# Patient Record
Sex: Male | Born: 1985 | Race: Black or African American | Hispanic: No | Marital: Single | Smoking: Current every day smoker
Health system: Southern US, Community
[De-identification: ages and names within clinical notes are randomized; demographics above are authoritative.]

## PROBLEM LIST (undated history)

## (undated) ENCOUNTER — Emergency Department (HOSPITAL_COMMUNITY): Admission: EM | Payer: Self-pay

## (undated) HISTORY — PX: APPENDECTOMY: SHX54

---
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
## 2006-01-13 ENCOUNTER — Emergency Department (HOSPITAL_COMMUNITY): Admission: EM | Admit: 2006-01-13 | Discharge: 2006-01-13 | Payer: Self-pay | Admitting: Emergency Medicine

## 2006-01-17 ENCOUNTER — Emergency Department (HOSPITAL_COMMUNITY): Admission: EM | Admit: 2006-01-17 | Discharge: 2006-01-17 | Payer: Self-pay | Admitting: Emergency Medicine

## 2007-04-01 ENCOUNTER — Emergency Department (HOSPITAL_COMMUNITY): Admission: EM | Admit: 2007-04-01 | Discharge: 2007-04-01 | Payer: Self-pay | Admitting: Emergency Medicine

## 2007-06-21 ENCOUNTER — Emergency Department (HOSPITAL_COMMUNITY): Admission: EM | Admit: 2007-06-21 | Discharge: 2007-06-22 | Payer: Self-pay | Admitting: Emergency Medicine

## 2010-09-30 NOTE — H&P (Signed)
NAME:  Zachary Weber, Zachary Weber                        ACCOUNT NO.:  [id number]   MEDICAL RECORD NO.:  [id number]                   PATIENT TYPE:  EMS   LOCATION:  ED                                   FACILITY:  [facility]   PHYSICIAN:  Adolph Pollack, M.D.            DATE OF BIRTH:  [birth year]   DATE OF ADMISSION:  01/12/2004  DATE OF DISCHARGE:                                HISTORY & PHYSICAL   CHIEF COMPLAINT:  Abdominal pain with diarrhea and chills.   HISTORY OF PRESENT ILLNESS:  This 25 year old male had something to eat on  Sunday, developed some crampy periumbilical pain followed by some diarrhea  and radiation of the pain to the right lower quadrant and lower abdominal  area.  He has also had chills.  The pain has persisted and gotten a little  bit worse.  He is still having some intermittent diarrhea.  He took Pepto-  Bismol but had no relief.  He states he has not had anything like this  before.  He was seen by Dr. Carren Rang in the emergency department with  concern for appendicitis and I was subsequently asked to see him.   PAST MEDICAL HISTORY:  No chronic illnesses.  Previous operations:  None.   ALLERGIES:  None.   MEDICATIONS:  None.   SOCIAL HISTORY:  He reports intermittently drinking alcohol.  Does do some  smoking.  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   FAMILY HISTORY:  Positive for diabetes and hypertension.   REVIEW OF SYSTEMS:  CARDIAC:  No known heart disease or hypertension.  PULMONARY:  No asthma or pneumonia.  GI:  No intestinal problems, no peptic  ulcer disease, no hepatitis.  GU:  No dysuria or hematuria.  NEUROLOGIC:  No  seizure disorders.  HEMATOLOGIC:  No sickle cell disease, bleeding problems.   PHYSICAL EXAMINATION:  GENERAL:  An ill-appearing male with temperature  101.1, blood pressure 113/58, pulse 96, respiratory rate 18, lying still on  his left side.  EYES:  Extraocular motions intact, no icterus.  NECK:  Supple  without masses or obvious thyroid enlargement.  RESPIRATORY:  Breast sounds equal and clear.  Respirations unlabored.  CARDIOVASCULAR:  Heart demonstrates a regular rate and rhythm, no murmur  heard.  No lower extremity edema, no JVD.  ABDOMEN:  Slightly firm with right lower quadrant tenderness and guarding to  both palpation and percussion.  No palpable masses are present.  Hypoactive  bowel sounds are noted.  MUSCULOSKELETAL:  Has good muscle tone present.  NEUROLOGIC:  He is alert and oriented and responds to questions  appropriately and follows commands.   LABORATORY DATA:  Urinalysis negative.  White blood cell count 17,900 with a  left shift.  Hemoglobin 14.1.  Potassium 3.3.  Liver function tests normal.   IMPRESSION:  Acute abdomen with signs and symptoms consistent with acute  appendicitis with possible  perforation.   PLAN:  To the operating room for diagnostic laparoscopy with appendectomy,  possible exploratory laparotomy.  I went over the procedure and rationale  and risks with him and his parents.  The risks include but are not limited  to bleeding, infection, accidental damage to intraabdominal organs, risks of  anesthesia, and the chance of it being something other than appendicitis.  They all seem to understand this and agree to proceed.                                               Adolph Pollack, M.D.    Kari Baars  D:  01/12/2004  T:  01/12/2004  Job:  [id number]

## 2010-09-30 NOTE — Op Note (Signed)
NAME:  Zachary Weber, Zachary Weber                        ACCOUNT NO.:  [id number]   MEDICAL RECORD NO.:  [id number]                   PATIENT TYPE:  INP   LOCATION:  0481                                 FACILITY:  [facility]   PHYSICIAN:  Adolph Pollack, M.D.            DATE OF BIRTH:  02-12-1986   DATE OF PROCEDURE:  01/12/2004  DATE OF DISCHARGE:                                 OPERATIVE REPORT   PREOPERATIVE DIAGNOSIS:  Acute appendicitis.   POSTOPERATIVE DIAGNOSIS:  Acute appendicitis.   PROCEDURE:  Laparoscopic appendectomy.   SURGEON:  Adolph Pollack, M.D.   ANESTHESIA:  General.   INDICATIONS:  This is a 25 year old male who reported that he was working in  the field, and he ate some supper on Sunday night and began having cramping  periumbilical pain followed by some diarrhea and radiation of the pain to  the right lower quadrant.  He came to the emergency department and was seen  by Dr. Carren Rang and was noted to be febrile with a high white blood  cell count.  On examination, there were findings consistent with acute  appendicitis.  He now presents to the operating room.   TECHNIQUE:  He is put on the operating room table, and a general anesthetic  was administered.  A Foley catheter was placed in the bladder.  The  abdominal wall hair was clipped, and the area was sterilely prepped and  draped.  Dilute Marcaine solution was infiltrated in the subumbilical  region, and a small subumbilical  incision was made through the skin,  subcutaneous tissue, and fascia.  The peritoneal cavity was entered bluntly  under direct vision.  A purse-string suture of 0 Vicryl was placed around  the fascial edges.  A Hasson trocar was introduced into the peritoneal  cavity, and pneumoperitoneum was created by insufflation of CO2 gas.  Next,  the laparoscope was introduced, and the patient was placed in the  Trendelenburg position with the right side tilted slightly upward.  A 5 mm  trocar  was placed in the left suprapubic region, and I manipulated the cecum  and exposed an inflamed appendix that was adherent to the right pelvic side  wall.  I was able to bluntly separate this from the pelvic side wall and  retract.  A 5 mm trocar was then placed in the right upper quadrant, and the  appendix was retracted anteriorly.  The mesoappendix was divided with the  harmonic scalpel down to the base of the cecum.  The appendix was then  amputated off the cecum with the Endo-GIA stapler.  It was placed into an  Endopouch bag, and the appendix was removed into the bag through the  subumbilical port.  A Hasson trocar was replaced through the subumbilical  incision.  The staple line was examined and was hemostatic.  Some purulent  yellowish fluid was evacuated from the pelvis, and a liter of  warm saline  was used to irrigate the area.  No bleeding was noted, and the saline was  evacuated.  There was no leakage noted from the cecal area.  Following this,  I evacuated as much fluid as possible.  I then removed the Hasson trocar and  closed the subumbilical fascial defect under direct vision by tightening up  and tying  a purse-string suture.  The remaining trocars were removed.  A  pneumoperitoneum was released.  The skin incisions were closed with 4-0  Monocryl subcuticular stitches followed by Steri-Strips and sterile  dressings.  He tolerated the procedure well without any apparent  complications.  He was taken to the recovery room in satisfactory condition.                                               Adolph Pollack, M.D.    Kari Baars  D:  01/12/2004  T:  01/12/2004  Job:  [id number]

## 2011-02-03 LAB — RPR: RPR Ser Ql: NONREACTIVE

## 2011-02-03 LAB — GC/CHLAMYDIA PROBE AMP, URINE: GC Probe Amp, Urine: NEGATIVE

## 2011-02-03 LAB — URINE MICROSCOPIC-ADD ON

## 2011-02-03 LAB — CBC
Hemoglobin: 15.7
MCHC: 34.5
Platelets: 269
RBC: 4.86
RDW: 13.2

## 2011-02-03 LAB — COMPREHENSIVE METABOLIC PANEL
AST: 23
Albumin: 4
BUN: 7
CO2: 25
Chloride: 107
Creatinine, Ser: 1.09
GFR calc Af Amer: >60
Potassium: 3.7
Sodium: 141

## 2011-02-03 LAB — DIFFERENTIAL
Basophils Relative: 1
Eosinophils Absolute: 0

## 2011-02-03 LAB — RAPID URINE DRUG SCREEN, HOSP PERFORMED
Benzodiazepines: NOT DETECTED
Cocaine: POSITIVE — AB
Opiates: NOT DETECTED

## 2011-02-03 LAB — URINALYSIS, ROUTINE W REFLEX MICROSCOPIC
Bilirubin Urine: NEGATIVE
Glucose, UA: NEGATIVE
Hgb urine dipstick: NEGATIVE
Protein, ur: 100 — AB
Specific Gravity, Urine: 1.033 — ABNORMAL HIGH
pH: 6

## 2011-02-03 LAB — URINE CULTURE: Colony Count: 8000

## 2011-02-21 LAB — CBC
HCT: 46.2
Hemoglobin: 15.5
RDW: 12.8
WBC: 6.7

## 2011-02-21 LAB — DIFFERENTIAL
Basophils Absolute: 0
Basophils Relative: 1
Eosinophils Absolute: 0.1 — ABNORMAL LOW
Monocytes Absolute: 0.5
Monocytes Relative: 8

## 2011-02-21 LAB — RAPID URINE DRUG SCREEN, HOSP PERFORMED
Cocaine: POSITIVE — AB
Tetrahydrocannabinol: POSITIVE — AB

## 2011-02-21 LAB — COMPREHENSIVE METABOLIC PANEL
ALT: 18
Albumin: 4.1
Alkaline Phosphatase: 75
Chloride: 100
Glucose, Bld: 95
Potassium: 3.4 — ABNORMAL LOW
Sodium: 138
Total Bilirubin: 0.8
Total Protein: 7.2

## 2011-02-21 LAB — ETHANOL: Alcohol, Ethyl (B): 24 — ABNORMAL HIGH

## 2011-02-21 LAB — URINALYSIS, ROUTINE W REFLEX MICROSCOPIC
Bilirubin Urine: NEGATIVE
Ketones, ur: NEGATIVE
Nitrite: NEGATIVE

## 2018-01-15 ENCOUNTER — Emergency Department (HOSPITAL_COMMUNITY): Admission: EM | Admit: 2018-01-15 | Discharge: 2018-01-15 | Discharge status: Against Medical Advice | Payer: Self-pay

## 2018-01-15 NOTE — ED Notes (Signed)
Pt. Called for triage X7 no answer

## 2018-01-15 NOTE — ED Triage Notes (Signed)
Pt called for Triage three times, no response.

## 2018-05-29 ENCOUNTER — Encounter (HOSPITAL_BASED_OUTPATIENT_CLINIC_OR_DEPARTMENT_OTHER): Payer: Self-pay

## 2018-05-29 ENCOUNTER — Emergency Department (HOSPITAL_BASED_OUTPATIENT_CLINIC_OR_DEPARTMENT_OTHER)
Admission: EM | Admit: 2018-05-29 | Discharge: 2018-05-29 | Disposition: A | Discharge status: Home / Self Care | Payer: Self-pay | Attending: Emergency Medicine | Admitting: Emergency Medicine

## 2018-05-29 ENCOUNTER — Emergency Department (HOSPITAL_BASED_OUTPATIENT_CLINIC_OR_DEPARTMENT_OTHER): Payer: Self-pay

## 2018-05-29 DIAGNOSIS — W010XXA Fall on same level from slipping, tripping and stumbling without subsequent striking against object, initial encounter: Secondary | ICD-10-CM | POA: Insufficient documentation

## 2018-05-29 DIAGNOSIS — Y9389 Activity, other specified: Secondary | ICD-10-CM | POA: Insufficient documentation

## 2018-05-29 DIAGNOSIS — S46002A Unspecified injury of muscle(s) and tendon(s) of the rotator cuff of left shoulder, initial encounter: Secondary | ICD-10-CM | POA: Insufficient documentation

## 2018-05-29 DIAGNOSIS — Y999 Unspecified external cause status: Secondary | ICD-10-CM | POA: Insufficient documentation

## 2018-05-29 DIAGNOSIS — Y929 Unspecified place or not applicable: Secondary | ICD-10-CM | POA: Insufficient documentation

## 2018-05-29 MED ORDER — NAPROXEN 375 MG PO TABS: ORAL_TABLET | ORAL | 0 refills | Status: DC

## 2018-05-29 MED ORDER — NAPROXEN 250 MG PO TABS
500.0000 mg | ORAL_TABLET | Freq: Once | ORAL | Status: AC
  Administered 2018-05-29: 500 mg via ORAL
  Filled 2018-05-29: qty 2

## 2018-05-29 NOTE — ED Triage Notes (Signed)
Pt states he fell on his shoulder about 4 days ago and pain has not gone away

## 2018-05-29 NOTE — ED Notes (Signed)
Patient is A&Ox4.  No signs of distress noted.  Please see providers complete history and physical exam.  

## 2018-05-29 NOTE — ED Provider Notes (Signed)
[facility] Provider Note: Zachary Dell, MD, FACEP  CSN: [id number] MRN: [id number] ARRIVAL: 05/29/18 at 0431 ROOM: MH05/MH05   CHIEF COMPLAINT  Shoulder Injury   HISTORY OF PRESENT ILLNESS  05/29/18 4:41 AM Zachary Weber is a 33 y.o. male who fell 4 days ago while playing with his nephew.  He injured his left shoulder.  He is having what he describes as sharp, severe pain in his left shoulder particularly with abduction or internal rotation.  He is also having difficulty gripping with his left thumb and index finger due to pain.  He is also having occasional paresthesias in his left hand.  He denies other injury.   History reviewed. No pertinent past medical history.  History reviewed. No pertinent surgical history.  No family history on file.  Social History   Tobacco Use  . Smoking status: Never Smoker  . Smokeless tobacco: Never Used  Substance Use Topics  . Alcohol use: Never    Frequency: Never  . Drug use: Never    Prior to Admission medications   Not on File    Allergies Patient has no allergy information on record.   REVIEW OF SYSTEMS  Negative except as noted here or in the History of Present Illness.   PHYSICAL EXAMINATION  Initial Vital Signs Blood pressure (!) 129/104, pulse 85, temperature 98.6 F (37 C), temperature source Oral, resp. rate 18, height 5\' 6"  (1.676 m), weight 81.6 kg, SpO2 98 %.  Examination General: Well-developed, well-nourished male in no acute distress; appearance consistent with age of record HENT: normocephalic; atraumatic Eyes: pupils equal, round and reactive to light; extraocular muscles intact Neck: supple Heart: regular rate and rhyth Lungs: clear to auscultation bilaterally Abdomen: soft; nondistended; nontender; bowel sounds present Extremities: No deformity; tenderness of anterior left shoulder with pain on attempted abduction and internal rotation; limited grip strength of left thumb and index  finger due to pain Neurologic: Awake, alert and oriented; motor function intact in all extremities and symmetric; no facial droop Skin: Warm and dry Psychiatric: Normal mood and affect   RESULTS  Summary of this visit's results, reviewed by myself:   EKG Interpretation  Date/Time:    Ventricular Rate:    PR Interval:    QRS Duration:   QT Interval:    QTC Calculation:   R Axis:     Text Interpretation:        Laboratory Studies: No results found for this or any previous visit (from the past 24 hour(s)). Imaging Studies: Dg Shoulder Left  Result Date: 05/29/2018 CLINICAL DATA:  Fall onto shoulder 4 days ago with continued pain EXAM: LEFT SHOULDER - 2+ VIEW COMPARISON:  None. FINDINGS: Os acromiale with upward marginal spurring. No acute fracture or malalignment. No spurring at the acromioclavicular glenohumeral joints. IMPRESSION: 1. No acute finding. 2. Os acromiale with spurring. Electronically Signed   By: Marnee Spring M.D.   On: 05/29/2018 05:14    ED COURSE and MDM  Nursing notes and initial vitals signs, including pulse oximetry, reviewed.  Vitals:   05/29/18 0437 05/29/18 0438  BP: (!) 129/104   Pulse: 85   Resp: 18   Temp: 98.6 F (37 C)   TempSrc: Oral   SpO2: 98%   Weight:  81.6 kg  Height:  5\' 6"  (1.676 m)   Semination consistent with acute rotator cuff injury.   PROCEDURES    ED DIAGNOSES     ICD-10-CM   1. Rotator cuff injury, left,  initial encounter [id number]        Zachary Weber, Zeplin Aleshire, MD 05/29/18 [phone]

## 2019-04-02 ENCOUNTER — Encounter (HOSPITAL_COMMUNITY): Payer: Self-pay | Admitting: Emergency Medicine

## 2019-04-02 ENCOUNTER — Other Ambulatory Visit: Payer: Self-pay

## 2019-04-02 ENCOUNTER — Emergency Department (HOSPITAL_COMMUNITY)
Admission: EM | Admit: 2019-04-02 | Discharge: 2019-04-02 | Disposition: A | Discharge status: Home / Self Care | Payer: Self-pay | Attending: Emergency Medicine | Admitting: Emergency Medicine

## 2019-04-02 DIAGNOSIS — J029 Acute pharyngitis, unspecified: Secondary | ICD-10-CM

## 2019-04-02 DIAGNOSIS — R059 Cough, unspecified: Secondary | ICD-10-CM

## 2019-04-02 DIAGNOSIS — R05 Cough: Secondary | ICD-10-CM | POA: Insufficient documentation

## 2019-04-02 DIAGNOSIS — R070 Pain in throat: Secondary | ICD-10-CM | POA: Insufficient documentation

## 2019-04-02 DIAGNOSIS — Z20828 Contact with and (suspected) exposure to other viral communicable diseases: Secondary | ICD-10-CM | POA: Insufficient documentation

## 2019-04-02 DIAGNOSIS — B349 Viral infection, unspecified: Secondary | ICD-10-CM | POA: Insufficient documentation

## 2019-04-02 LAB — SARS CORONAVIRUS 2 (TAT 6-24 HRS): SARS Coronavirus 2: NEGATIVE

## 2019-04-02 MED ORDER — BENZONATATE 100 MG PO CAPS: 100.0000 mg | ORAL_CAPSULE | Freq: Three times a day (TID) | ORAL | 0 refills | Status: DC

## 2019-04-02 MED ORDER — LIDOCAINE VISCOUS HCL 2 % MT SOLN
15.0000 mL | Freq: Once | OROMUCOSAL | Status: AC
  Administered 2019-04-02: 11:00:00 15 mL via OROMUCOSAL
  Filled 2019-04-02: qty 15

## 2019-04-02 MED ORDER — PHENOL 1.4 % MT LIQD: 1.0000 | OROMUCOSAL | 0 refills | Status: DC | PRN

## 2019-04-02 NOTE — Discharge Instructions (Addendum)
You likely have a viral illness.  This should be treated symptomatically.  Use Tylenol or ibuprofen as needed for fevers or body aches. Use sore throat spray as needed for throat pain. Use Tessalon to help with cough. You are being tested for coronavirus.  If results are positive, you will receive a phone call.  If negative, you will not.  Either way, you may check online on MyChart. Until results return, you will need to quarantine.  If negative, you can return.  Normal activities.  If positive, you will need to quarantine per the instructions given to you Make sure you stay well-hydrated with water. Wash your hands frequently to prevent spread of infection. Follow-up with your primary care doctor in 1 week if your symptoms are not improving. Return to the emergency room if you develop chest pain, difficulty breathing, or any new or worsening symptoms.

## 2019-04-02 NOTE — ED Provider Notes (Signed)
[facility] EMERGENCY DEPARTMENT Provider Note   CSN: [id number] Arrival date & time: 04/02/19  0940     History   Chief Complaint Chief Complaint  Patient presents with  . Sore Throat  . Cough    HPI Zachary Weber is a 33 y.o. male presenting for sore throat and cough.  Patient states 2 days ago he developed a sore throat.  He reports associated nonproductive cough.  He reports epigastric abdominal pain when he coughs, but no pain other times.  He denies fevers, chills, ear pain, nasal congestion, chest pain, shortness of breath, nausea, vomiting, urinary symptoms, abnormal bowel movements.  He denies sick contacts.  He denies contact with known COVID-19 positive person.  Patient states he just returned from [facility], came via car.  He smokes a pack and a half of cigarettes a day.  He has not taken anything for his symptoms.     HPI  History reviewed. No pertinent past medical history.  There are no active problems to display for this patient.   History reviewed. No pertinent surgical history.      Home Medications    Prior to Admission medications   Medication Sig Start Date End Date Taking? Authorizing Provider  benzonatate (TESSALON) 100 MG capsule Take 1 capsule (100 mg total) by mouth every 8 (eight) hours. 04/02/19   Tejah Brekke, PA-C  naproxen (NAPROSYN) 375 MG tablet Take 1 tablet twice daily as needed for shoulder pain. 05/29/18   Molpus, John, MD  phenol (CHLORASEPTIC) 1.4 % LIQD Use as directed 1 spray in the mouth or throat as needed for throat irritation / pain. 04/02/19   Doretha Goding, PA-C    Family History No family history on file.  Social History Social History   Tobacco Use  . Smoking status: Never Smoker  . Smokeless tobacco: Never Used  Substance Use Topics  . Alcohol use: Never    Frequency: Never  . Drug use: Never     Allergies   Patient has no allergy information on record.   Review  of Systems Review of Systems  HENT: Positive for sore throat.   Respiratory: Positive for cough.      Physical Exam Updated Vital Signs BP 137/72 (BP Location: Left Arm)   Pulse (!) 102   Temp 99.1 F (37.3 C) (Oral)   Resp 18   SpO2 96%   Physical Exam Vitals signs and nursing note reviewed.  Constitutional:      General: He is not in acute distress.    Appearance: He is well-developed.     Comments: Sitting comfortably in the bed in no acute distress  HENT:     Head: Normocephalic and atraumatic.     Right Ear: Tympanic membrane, ear canal and external ear normal.     Left Ear: Tympanic membrane, ear canal and external ear normal.     Nose: Mucosal edema present.     Right Sinus: No maxillary sinus tenderness or frontal sinus tenderness.     Left Sinus: No maxillary sinus tenderness or frontal sinus tenderness.     Mouth/Throat:     Pharynx: Uvula midline.     Tonsils: No tonsillar exudate.     Comments: OP erythematous without tonsillar swelling or exudate.  Uvula midline with equal palate rise.  TMs nonerythematous nonbulging bilaterally. Eyes:     Conjunctiva/sclera: Conjunctivae normal.     Pupils: Pupils are equal, round, and reactive to light.  Neck:  Musculoskeletal: Normal range of motion.  Cardiovascular:     Rate and Rhythm: Normal rate and regular rhythm.     Pulses: Normal pulses.     Comments: Heart rate normal on my exam Pulmonary:     Effort: Pulmonary effort is normal.     Breath sounds: Normal breath sounds. No decreased breath sounds, wheezing, rhonchi or rales.     Comments: Clear lung sounds in all fields.  No wheezing, rales, rhonchi.  Speaking in full sentences.  No signs of respiratory distress or accessory muscle use. Abdominal:     General: There is no distension.     Palpations: Abdomen is soft. There is no mass.     Tenderness: There is no abdominal tenderness. There is no guarding or rebound.  Musculoskeletal: Normal range of motion.   Lymphadenopathy:     Cervical: No cervical adenopathy.  Skin:    General: Skin is warm.     Capillary Refill: Capillary refill takes less than 2 seconds.  Neurological:     Mental Status: He is alert and oriented to person, place, and time.      ED Treatments / Results  Labs (all labs ordered are listed, but only abnormal results are displayed) Labs Reviewed  SARS CORONAVIRUS 2 (TAT 6-24 HRS)    EKG None  Radiology No results found.  Procedures Procedures (including critical care time)  Medications Ordered in ED Medications  lidocaine (XYLOCAINE) 2 % viscous mouth solution 15 mL (15 mLs Mouth/Throat Given 04/02/19 1052)     Initial Impression / Assessment and Plan / ED Course  I have reviewed the triage vital signs and the nursing notes.  Pertinent labs & imaging results that were available during my care of the patient were reviewed by me and considered in my medical decision making (see chart for details).        Patient presenting with 2 day h/o URI symptoms.  Physical exam reassuring, patient is afebrile and appears nontoxic.  Pulmonary exam reassuring.  Doubt pneumonia, strep, other bacterial infection, or peritonsillar abscess. Likely viral URI. Will test for Covid, although presentation is not classic.  Will treat symptomatically.  Patient to follow-up with primary care as needed.  At this time, patient appears safe for discharge.  Return precautions given.  Patient states he understands and agrees to plan.  Zachary Weber was evaluated in Emergency Department on 04/02/2019 for the symptoms described in the history of present illness. He was evaluated in the context of the global COVID-19 pandemic, which necessitated consideration that the patient might be at risk for infection with the SARS-CoV-2 virus that causes COVID-19. Institutional protocols and algorithms that pertain to the evaluation of patients at risk for COVID-19 are in a state of rapid change based  on information released by regulatory bodies including the CDC and federal and state organizations. These policies and algorithms were followed during the patient's care in the ED.  Final Clinical Impressions(s) / ED Diagnoses   Final diagnoses:  Sore throat  Cough  Viral illness    ED Discharge Orders         Ordered    phenol (CHLORASEPTIC) 1.4 % LIQD  As needed     04/02/19 1046    benzonatate (TESSALON) 100 MG capsule  Every 8 hours     04/02/19 1046           Tyyne Cliett, PA-C 04/02/19 1518    Wyvonnia Dusky, MD 04/02/19 [phone]

## 2019-04-02 NOTE — ED Triage Notes (Signed)
C/o non-productive cough and sore throat since yesterday.  Abd pain only when coughing.

## 2019-07-04 ENCOUNTER — Emergency Department (HOSPITAL_BASED_OUTPATIENT_CLINIC_OR_DEPARTMENT_OTHER)
Admission: EM | Admit: 2019-07-04 | Discharge: 2019-07-04 | Disposition: A | Discharge status: Home / Self Care | Payer: Self-pay | Attending: Emergency Medicine | Admitting: Emergency Medicine

## 2019-07-04 ENCOUNTER — Encounter (HOSPITAL_BASED_OUTPATIENT_CLINIC_OR_DEPARTMENT_OTHER): Payer: Self-pay | Admitting: *Deleted

## 2019-07-04 ENCOUNTER — Other Ambulatory Visit: Payer: Self-pay

## 2019-07-04 DIAGNOSIS — F17210 Nicotine dependence, cigarettes, uncomplicated: Secondary | ICD-10-CM | POA: Insufficient documentation

## 2019-07-04 DIAGNOSIS — K5900 Constipation, unspecified: Secondary | ICD-10-CM | POA: Insufficient documentation

## 2019-07-04 DIAGNOSIS — K6289 Other specified diseases of anus and rectum: Secondary | ICD-10-CM

## 2019-07-04 MED ORDER — HYDROCORTISONE (PERIANAL) 2.5 % EX CREA: 1.0000 "application " | TOPICAL_CREAM | Freq: Two times a day (BID) | CUTANEOUS | 0 refills | Status: DC

## 2019-07-04 MED ORDER — POLYETHYLENE GLYCOL 3350 17 G PO PACK: 17.0000 g | PACK | Freq: Every day | ORAL | 0 refills | Status: AC

## 2019-07-04 NOTE — ED Triage Notes (Signed)
Abdominal pain and constipation. Rectal burning after a bowel movement.

## 2019-07-04 NOTE — Discharge Instructions (Addendum)
As we discussed, you may have a small anal fissure or internal hemorrhoid that is causing her irritation.  Take MiraLAX daily as directed.  If you start having good regular bowel movements, stop using it.  You can apply the Anusol as directed.  Return the emergency department for worsening pain, inability to have a bowel movement, vomiting, fever, abdominal pain.

## 2019-07-04 NOTE — ED Provider Notes (Signed)
[facility] EMERGENCY DEPARTMENT Provider Note   CSN: [id number] Arrival date & time: 07/04/19  1556     History Chief Complaint  Patient presents with  . Abdominal Pain  . Constipation    Zachary Weber is a 34 y.o. male who presents for evaluation of ductal pain with bowel movements and abdominal bloating.  He states that over the last 3 days, whenever he has a bowel movement, he has to strain and he feels like it is scraping his rectum.  He states that the area will burn and will cause him to hurt.  This rectal pain only occurs with having bowel movements and he does report significant straining.  He states he is still been able to have bowel movements and his last bowel movement was this morning.  No blood noted in the stools.  He states that he also feels like he is bloated but denies any abdominal pain.  He has not taken any medication to help with his symptoms.  He has not noted any fevers, nausea/vomiting, chest pain, difficulty breathing, urinary complaints.  The history is provided by the patient.       History reviewed. No pertinent past medical history.  There are no problems to display for this patient.   Past Surgical History:  Procedure Laterality Date  . APPENDECTOMY         No family history on file.  Social History   Tobacco Use  . Smoking status: Current Every Day Smoker  . Smokeless tobacco: Never Used  Substance Use Topics  . Alcohol use: Yes  . Drug use: Never    Home Medications Prior to Admission medications   Medication Sig Start Date End Date Taking? Authorizing Provider  benzonatate (TESSALON) 100 MG capsule Take 1 capsule (100 mg total) by mouth every 8 (eight) hours. 04/02/19   Caccavale, Sophia, PA-C  hydrocortisone (ANUSOL-HC) 2.5 % rectal cream Place 1 application rectally 2 (two) times daily. 07/04/19   Maxwell Caul, PA-C  naproxen (NAPROSYN) 375 MG tablet Take 1 tablet twice daily as needed for shoulder pain. 05/29/18    Molpus, John, MD  phenol (CHLORASEPTIC) 1.4 % LIQD Use as directed 1 spray in the mouth or throat as needed for throat irritation / pain. 04/02/19   Caccavale, Sophia, PA-C  polyethylene glycol (MIRALAX) 17 g packet Take 17 g by mouth daily for 7 days. 07/04/19 07/11/19  Maxwell Caul, PA-C    Allergies    Patient has no known allergies.  Review of Systems   Review of Systems  Constitutional: Negative for fever.  Respiratory: Negative for cough and shortness of breath.   Cardiovascular: Negative for chest pain.  Gastrointestinal: Positive for rectal pain. Negative for abdominal pain, blood in stool, nausea and vomiting.  Neurological: Negative for headaches.  All other systems reviewed and are negative.   Physical Exam Updated Vital Signs BP (!) 141/96   Pulse 95   Temp 98 F (36.7 C) (Oral)   Resp 20   Ht 5\' 8"  (1.727 m)   Wt 108.9 kg   SpO2 100%   BMI 36.49 kg/m   Physical Exam Vitals and nursing note reviewed. Exam conducted with a chaperone present.  Constitutional:      Appearance: Normal appearance. He is well-developed.  HENT:     Head: Normocephalic and atraumatic.  Eyes:     General: Lids are normal.     Conjunctiva/sclera: Conjunctivae normal.     Pupils: Pupils are equal,  round, and reactive to light.  Cardiovascular:     Rate and Rhythm: Normal rate and regular rhythm.     Pulses: Normal pulses.     Heart sounds: Normal heart sounds. No murmur. No friction rub. No gallop.   Pulmonary:     Effort: Pulmonary effort is normal.     Breath sounds: Normal breath sounds.     Comments: Lungs clear to auscultation bilaterally.  Symmetric chest rise.  No wheezing, rales, rhonchi. Abdominal:     Palpations: Abdomen is soft. Abdomen is not rigid.     Tenderness: There is no abdominal tenderness. There is no guarding.     Comments: Abdomen is soft, non-distended, non-tender. No rigidity, No guarding. No peritoneal signs.  Genitourinary:    Comments: The exam  was performed with a chaperone present.  Limited exam secondary to Patient's cooperation.  No external hemorrhoid visualized.  No swelling, redness noted around the anus.  No obvious anal fissure.  Patient would not let me proceed with rectal exam and declined rectal. Musculoskeletal:        General: Normal range of motion.     Cervical back: Full passive range of motion without pain.  Skin:    General: Skin is warm and dry.     Capillary Refill: Capillary refill takes less than 2 seconds.  Neurological:     Mental Status: He is alert and oriented to person, place, and time.  Psychiatric:        Speech: Speech normal.     ED Results / Procedures / Treatments   Labs (all labs ordered are listed, but only abnormal results are displayed) Labs Reviewed - No data to display  EKG None  Radiology No results found.  Procedures Procedures (including critical care time)  Medications Ordered in ED Medications - No data to display  ED Course  I have reviewed the triage vital signs and the nursing notes.  Pertinent labs & imaging results that were available during my care of the patient were reviewed by me and considered in my medical decision making (see chart for details).    MDM Rules/Calculators/A&P                      34 year old male who presents for evaluation of pain with bowel movements and abdominal bloating x3 days.  He states he has been having bowel movements but states that they are harder and he has to strain.  He states that whenever he has bowel movements, he has rectal pain.  He has not noted any blood.  This rectal pain only occurs with bowel movements.  Reports feeling bloated.  No nausea, vomiting, fevers, difficulty urinating.  Concern for anal fissure versus hemorrhoid.  Rectal exam showed no obvious external hemorrhoid, perianal redness, swelling.  No obvious anal fissure noted.  Visualization was limited secondary to patient cooperation.  Patient declined rectal  exam.  I discussed with him that this would be an incomplete exam and that we could potentially be missing things.  Patient expressed understanding and wished declined rectal exam at this time.  At this time, patient does not have abdominal pain.  He reports feeling bloated.  Triage note mentions constipation but patient has had a bowel movement today and yesterday.  He just reports it feeling harder and is causing him pain.  We will plan to treat as possible anal fissure/hemorrhoid pain. At this time, patient exhibits no emergent life-threatening condition that require further evaluation in  ED or admission. Patient had ample opportunity for questions and discussion. All patient's questions were answered with full understanding. Strict return precautions discussed. Patient expresses understanding and agreement to plan.   Portions of this note were generated with Scientist, clinical (histocompatibility and immunogenetics). Dictation errors may occur despite best attempts at proofreading.  Final Clinical Impression(s) / ED Diagnoses Final diagnoses:  Constipation, unspecified constipation type  Rectal pain    Rx / DC Orders ED Discharge Orders         Ordered    hydrocortisone (ANUSOL-HC) 2.5 % rectal cream  2 times daily     07/04/19 1834    polyethylene glycol (MIRALAX) 17 g packet  Daily     07/04/19 1834           Maxwell Caul, PA-C 07/05/19 0040    Jacalyn Lefevre, MD 07/05/19 2036

## 2019-07-04 NOTE — ED Notes (Signed)
Pt denied rectal exam by PA x2  PA explained consequences of denial and pt verbalized understanding.

## 2019-10-16 ENCOUNTER — Encounter (HOSPITAL_COMMUNITY): Payer: Self-pay

## 2019-10-16 ENCOUNTER — Emergency Department (HOSPITAL_COMMUNITY): Payer: No Typology Code available for payment source

## 2019-10-16 ENCOUNTER — Emergency Department (HOSPITAL_COMMUNITY)
Admission: EM | Admit: 2019-10-16 | Discharge: 2019-10-17 | Disposition: A | Discharge status: Home / Self Care | Payer: No Typology Code available for payment source | Attending: Emergency Medicine | Admitting: Emergency Medicine

## 2019-10-16 DIAGNOSIS — Y939 Activity, unspecified: Secondary | ICD-10-CM | POA: Diagnosis not present

## 2019-10-16 DIAGNOSIS — M542 Cervicalgia: Secondary | ICD-10-CM | POA: Diagnosis not present

## 2019-10-16 DIAGNOSIS — R000 Tachycardia, unspecified: Secondary | ICD-10-CM | POA: Insufficient documentation

## 2019-10-16 DIAGNOSIS — Y999 Unspecified external cause status: Secondary | ICD-10-CM | POA: Diagnosis not present

## 2019-10-16 DIAGNOSIS — R1031 Right lower quadrant pain: Secondary | ICD-10-CM | POA: Diagnosis present

## 2019-10-16 DIAGNOSIS — F17210 Nicotine dependence, cigarettes, uncomplicated: Secondary | ICD-10-CM | POA: Diagnosis not present

## 2019-10-16 DIAGNOSIS — M545 Low back pain: Secondary | ICD-10-CM | POA: Insufficient documentation

## 2019-10-16 DIAGNOSIS — Y929 Unspecified place or not applicable: Secondary | ICD-10-CM | POA: Diagnosis not present

## 2019-10-16 DIAGNOSIS — S0990XA Unspecified injury of head, initial encounter: Secondary | ICD-10-CM | POA: Insufficient documentation

## 2019-10-16 DIAGNOSIS — S3991XA Unspecified injury of abdomen, initial encounter: Secondary | ICD-10-CM | POA: Diagnosis not present

## 2019-10-16 DIAGNOSIS — T1490XA Injury, unspecified, initial encounter: Secondary | ICD-10-CM

## 2019-10-16 LAB — URINALYSIS, ROUTINE W REFLEX MICROSCOPIC
Bacteria, UA: NONE SEEN
Bilirubin Urine: NEGATIVE
Glucose, UA: NEGATIVE mg/dL
Hgb urine dipstick: NEGATIVE
Ketones, ur: NEGATIVE mg/dL
Nitrite: NEGATIVE
Protein, ur: NEGATIVE mg/dL
Specific Gravity, Urine: 1.008 (ref 1.005–1.030)
pH: 6 (ref 5.0–8.0)

## 2019-10-16 LAB — COMPREHENSIVE METABOLIC PANEL
ALT: 27 U/L (ref 0–44)
AST: 24 U/L (ref 15–41)
Albumin: 4 g/dL (ref 3.5–5.0)
Alkaline Phosphatase: 60 U/L (ref 38–126)
Anion gap: 15 (ref 5–15)
BUN: 7 mg/dL (ref 6–20)
CO2: 23 mmol/L (ref 22–32)
Calcium: 9.3 mg/dL (ref 8.9–10.3)
Chloride: 102 mmol/L (ref 98–111)
Creatinine, Ser: 1.08 mg/dL (ref 0.61–1.24)
GFR calc Af Amer: >60 mL/min (ref >60)
GFR calc non Af Amer: >60 mL/min (ref >60)
Glucose, Bld: 106 mg/dL — ABNORMAL HIGH (ref 70–99)
Potassium: 3.3 mmol/L — ABNORMAL LOW (ref 3.5–5.1)
Sodium: 140 mmol/L (ref 135–145)
Total Bilirubin: 0.8 mg/dL (ref 0.3–1.2)
Total Protein: 7.6 g/dL (ref 6.5–8.1)

## 2019-10-16 LAB — RAPID URINE DRUG SCREEN, HOSP PERFORMED
Amphetamines: NOT DETECTED
Barbiturates: NOT DETECTED
Benzodiazepines: NOT DETECTED
Cocaine: NOT DETECTED
Opiates: NOT DETECTED
Tetrahydrocannabinol: NOT DETECTED

## 2019-10-16 LAB — I-STAT CHEM 8, ED
BUN: 7 mg/dL (ref 6–20)
Calcium, Ion: 1.11 mmol/L — ABNORMAL LOW (ref 1.15–1.40)
Chloride: 103 mmol/L (ref 98–111)
Creatinine, Ser: 1.1 mg/dL (ref 0.61–1.24)
Glucose, Bld: 105 mg/dL — ABNORMAL HIGH (ref 70–99)
HCT: 48 % (ref 39.0–52.0)
Hemoglobin: 16.3 g/dL (ref 13.0–17.0)
Potassium: 3.4 mmol/L — ABNORMAL LOW (ref 3.5–5.1)
Sodium: 142 mmol/L (ref 135–145)
TCO2: 26 mmol/L (ref 22–32)

## 2019-10-16 LAB — CBC
HCT: 46.8 % (ref 39.0–52.0)
Hemoglobin: 15.7 g/dL (ref 13.0–17.0)
MCH: 31.7 pg (ref 26.0–34.0)
MCHC: 33.5 g/dL (ref 30.0–36.0)
MCV: 94.5 fL (ref 80.0–100.0)
Platelets: 345 10*3/uL (ref 150–400)
RBC: 4.95 MIL/uL (ref 4.22–5.81)
RDW: 13.2 % (ref 11.5–15.5)
WBC: 10.2 10*3/uL (ref 4.0–10.5)
nRBC: 0 % (ref 0.0–0.2)

## 2019-10-16 LAB — SAMPLE TO BLOOD BANK

## 2019-10-16 LAB — LACTIC ACID, PLASMA: Lactic Acid, Venous: 2.8 mmol/L (ref 0.5–1.9)

## 2019-10-16 LAB — PROTIME-INR
INR: 1 (ref 0.8–1.2)
Prothrombin Time: 12.8 seconds (ref 11.4–15.2)

## 2019-10-16 LAB — ETHANOL: Alcohol, Ethyl (B): 20 mg/dL — ABNORMAL HIGH (ref <10)

## 2019-10-16 MED ORDER — LACTATED RINGERS IV BOLUS: 1000.0000 mL | Freq: Once | INTRAVENOUS | Status: DC

## 2019-10-16 MED ORDER — IOHEXOL 300 MG/ML  SOLN
100.0000 mL | Freq: Once | INTRAMUSCULAR | Status: AC | PRN
  Administered 2019-10-16: 100 mL via INTRAVENOUS
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
## 2019-10-16 NOTE — ED Notes (Signed)
Pt refused IV fluid

## 2019-10-17 ENCOUNTER — Encounter (HOSPITAL_BASED_OUTPATIENT_CLINIC_OR_DEPARTMENT_OTHER): Payer: Self-pay | Admitting: *Deleted
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
## 2020-12-18 ENCOUNTER — Other Ambulatory Visit: Payer: Self-pay

## 2020-12-18 ENCOUNTER — Emergency Department (HOSPITAL_COMMUNITY)
Admission: EM | Admit: 2020-12-18 | Discharge: 2020-12-19 | Disposition: A | Discharge status: Home / Self Care | Payer: Self-pay | Attending: Emergency Medicine | Admitting: Emergency Medicine

## 2020-12-18 DIAGNOSIS — Z5321 Procedure and treatment not carried out due to patient leaving prior to being seen by health care provider: Secondary | ICD-10-CM | POA: Insufficient documentation

## 2020-12-18 DIAGNOSIS — G43909 Migraine, unspecified, not intractable, without status migrainosus: Secondary | ICD-10-CM | POA: Insufficient documentation

## 2020-12-18 NOTE — ED Triage Notes (Signed)
Pt POV reports severe headache x2 days. Reports taking excedrin migraine with some relief but headache returns when he wakes up.

## 2022-01-06 ENCOUNTER — Encounter (HOSPITAL_COMMUNITY): Payer: Self-pay

## 2022-01-06 ENCOUNTER — Emergency Department (HOSPITAL_COMMUNITY): Payer: No Typology Code available for payment source

## 2022-01-06 ENCOUNTER — Other Ambulatory Visit: Payer: Self-pay

## 2022-01-06 ENCOUNTER — Emergency Department (HOSPITAL_COMMUNITY)
Admission: EM | Admit: 2022-01-06 | Discharge: 2022-01-06 | Disposition: A | Discharge status: Home / Self Care | Payer: No Typology Code available for payment source | Attending: Emergency Medicine | Admitting: Emergency Medicine

## 2022-01-06 DIAGNOSIS — Y92410 Unspecified street and highway as the place of occurrence of the external cause: Secondary | ICD-10-CM | POA: Diagnosis not present

## 2022-01-06 DIAGNOSIS — M549 Dorsalgia, unspecified: Secondary | ICD-10-CM | POA: Diagnosis not present

## 2022-01-06 DIAGNOSIS — M79641 Pain in right hand: Secondary | ICD-10-CM | POA: Insufficient documentation

## 2022-01-06 MED ORDER — METHOCARBAMOL 500 MG PO TABS: 500.0000 mg | ORAL_TABLET | Freq: Two times a day (BID) | ORAL | 0 refills | Status: DC | PRN

## 2022-01-06 MED ORDER — ACETAMINOPHEN 500 MG PO TABS
1000.0000 mg | ORAL_TABLET | Freq: Once | ORAL | Status: AC
  Administered 2022-01-06: 1000 mg via ORAL
  Filled 2022-01-06: qty 2
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
## 2022-03-22 ENCOUNTER — Encounter (HOSPITAL_BASED_OUTPATIENT_CLINIC_OR_DEPARTMENT_OTHER): Payer: Self-pay | Admitting: Urology

## 2022-03-22 ENCOUNTER — Other Ambulatory Visit (HOSPITAL_BASED_OUTPATIENT_CLINIC_OR_DEPARTMENT_OTHER): Payer: Self-pay

## 2022-03-22 ENCOUNTER — Emergency Department (HOSPITAL_BASED_OUTPATIENT_CLINIC_OR_DEPARTMENT_OTHER)
Admission: EM | Admit: 2022-03-22 | Discharge: 2022-03-22 | Disposition: A | Discharge status: Home / Self Care | Payer: Self-pay | Attending: Emergency Medicine | Admitting: Emergency Medicine

## 2022-03-22 ENCOUNTER — Emergency Department (HOSPITAL_BASED_OUTPATIENT_CLINIC_OR_DEPARTMENT_OTHER): Payer: Self-pay

## 2022-03-22 DIAGNOSIS — Y92410 Unspecified street and highway as the place of occurrence of the external cause: Secondary | ICD-10-CM | POA: Insufficient documentation

## 2022-03-22 DIAGNOSIS — M545 Low back pain, unspecified: Secondary | ICD-10-CM | POA: Insufficient documentation

## 2022-03-22 DIAGNOSIS — M546 Pain in thoracic spine: Secondary | ICD-10-CM | POA: Insufficient documentation

## 2022-03-22 DIAGNOSIS — M549 Dorsalgia, unspecified: Secondary | ICD-10-CM | POA: Diagnosis present

## 2022-03-22 MED ORDER — METHOCARBAMOL 500 MG PO TABS
500.0000 mg | ORAL_TABLET | Freq: Two times a day (BID) | ORAL | 0 refills | Status: DC | PRN
  Filled 2022-03-22: qty 20, 10d supply, fill #0
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
## 2022-03-22 NOTE — ED Notes (Signed)
Patient transported to X-ray 

## 2022-03-22 NOTE — ED Notes (Signed)
Pt given crackers and juice per request, waiting comfortably for DC

## 2022-03-30 ENCOUNTER — Other Ambulatory Visit (HOSPITAL_BASED_OUTPATIENT_CLINIC_OR_DEPARTMENT_OTHER): Payer: Self-pay
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
## 2022-11-07 ENCOUNTER — Other Ambulatory Visit: Payer: Self-pay

## 2022-11-07 ENCOUNTER — Observation Stay (HOSPITAL_COMMUNITY)
Admission: EM | Admit: 2022-11-07 | Discharge: 2022-11-07 | Disposition: A | Discharge status: Home / Self Care | Payer: Self-pay | Attending: Family Medicine | Admitting: Family Medicine

## 2022-11-07 ENCOUNTER — Encounter (HOSPITAL_COMMUNITY): Payer: Self-pay | Admitting: Emergency Medicine

## 2022-11-07 DIAGNOSIS — T50904A Poisoning by unspecified drugs, medicaments and biological substances, undetermined, initial encounter: Principal | ICD-10-CM

## 2022-11-07 DIAGNOSIS — Z79899 Other long term (current) drug therapy: Secondary | ICD-10-CM | POA: Insufficient documentation

## 2022-11-07 DIAGNOSIS — I10 Essential (primary) hypertension: Secondary | ICD-10-CM | POA: Insufficient documentation

## 2022-11-07 DIAGNOSIS — F17210 Nicotine dependence, cigarettes, uncomplicated: Secondary | ICD-10-CM | POA: Insufficient documentation

## 2022-11-07 DIAGNOSIS — N179 Acute kidney failure, unspecified: Secondary | ICD-10-CM | POA: Insufficient documentation

## 2022-11-07 DIAGNOSIS — T50901A Poisoning by unspecified drugs, medicaments and biological substances, accidental (unintentional), initial encounter: Secondary | ICD-10-CM | POA: Diagnosis present

## 2022-11-07 LAB — CBC WITH DIFFERENTIAL/PLATELET
Abs Immature Granulocytes: 0.05 10*3/uL (ref 0.00–0.07)
Basophils Absolute: 0 10*3/uL (ref 0.0–0.1)
Basophils Relative: 0 %
Eosinophils Absolute: 0.4 10*3/uL (ref 0.0–0.5)
Eosinophils Relative: 4 %
HCT: 46.7 % (ref 39.0–52.0)
Hemoglobin: 15.7 g/dL (ref 13.0–17.0)
Immature Granulocytes: 1 %
Lymphocytes Relative: 35 %
Lymphs Abs: 3.4 10*3/uL (ref 0.7–4.0)
MCH: 31.5 pg (ref 26.0–34.0)
MCHC: 33.6 g/dL (ref 30.0–36.0)
MCV: 93.8 fL (ref 80.0–100.0)
Monocytes Absolute: 0.7 10*3/uL (ref 0.1–1.0)
Monocytes Relative: 7 %
Neutro Abs: 5.1 10*3/uL (ref 1.7–7.7)
Neutrophils Relative %: 53 %
Platelets: 287 10*3/uL (ref 150–400)
RBC: 4.98 MIL/uL (ref 4.22–5.81)
RDW: 12.9 % (ref 11.5–15.5)
WBC: 9.7 10*3/uL (ref 4.0–10.5)
nRBC: 0 % (ref 0.0–0.2)

## 2022-11-07 LAB — COMPREHENSIVE METABOLIC PANEL
ALT: 16 U/L (ref 0–44)
AST: 31 U/L (ref 15–41)
Albumin: 4.4 g/dL (ref 3.5–5.0)
Alkaline Phosphatase: 64 U/L (ref 38–126)
Anion gap: 19 — ABNORMAL HIGH (ref 5–15)
BUN: 13 mg/dL (ref 6–20)
CO2: 16 mmol/L — ABNORMAL LOW (ref 22–32)
Calcium: 9.4 mg/dL (ref 8.9–10.3)
Chloride: 106 mmol/L (ref 98–111)
Creatinine, Ser: 1.8 mg/dL — ABNORMAL HIGH (ref 0.61–1.24)
GFR, Estimated: 49 mL/min — ABNORMAL LOW (ref >60)
Glucose, Bld: 132 mg/dL — ABNORMAL HIGH (ref 70–99)
Potassium: 3.4 mmol/L — ABNORMAL LOW (ref 3.5–5.1)
Sodium: 141 mmol/L (ref 135–145)
Total Bilirubin: 0.9 mg/dL (ref 0.3–1.2)
Total Protein: 7.9 g/dL (ref 6.5–8.1)

## 2022-11-07 LAB — ACETAMINOPHEN LEVEL: Acetaminophen (Tylenol), Serum: <10 ug/mL — ABNORMAL LOW (ref 10–30)

## 2022-11-07 LAB — ETHANOL: Alcohol, Ethyl (B): <10 mg/dL (ref <10)

## 2022-11-07 LAB — CK: Total CK: 251 U/L (ref 49–397)

## 2022-11-07 LAB — SALICYLATE LEVEL: Salicylate Lvl: <7 mg/dL — ABNORMAL LOW (ref 7.0–30.0)

## 2022-11-07 LAB — MAGNESIUM: Magnesium: 2.1 mg/dL (ref 1.7–2.4)

## 2022-11-07 LAB — TROPONIN I (HIGH SENSITIVITY): Troponin I (High Sensitivity): 19 ng/L — ABNORMAL HIGH (ref <18)

## 2022-11-07 MED ORDER — DIAZEPAM 5 MG/ML IJ SOLN
5.0000 mg | Freq: Once | INTRAMUSCULAR | Status: DC
  Filled 2022-11-07: qty 2

## 2022-11-07 MED ORDER — DIAZEPAM 5 MG/ML IJ SOLN: 5.0000 mg | Freq: Once | INTRAMUSCULAR | Status: DC

## 2022-11-07 MED ORDER — MIDAZOLAM HCL (PF) 10 MG/2ML IJ SOLN
4.0000 mg | Freq: Once | INTRAMUSCULAR | Status: AC
  Administered 2022-11-07: 4 mg via INTRAMUSCULAR
  Filled 2022-11-07: qty 2

## 2022-11-07 MED ORDER — SODIUM CHLORIDE 0.9 % IV BOLUS
1000.0000 mL | Freq: Once | INTRAVENOUS | Status: AC
  Administered 2022-11-07: 1000 mL via INTRAVENOUS

## 2022-11-07 MED ORDER — DIAZEPAM 5 MG/ML IJ SOLN
5.0000 mg | Freq: Once | INTRAMUSCULAR | Status: AC
  Administered 2022-11-07: 5 mg via INTRAVENOUS

## 2022-11-07 MED ORDER — ENOXAPARIN SODIUM 40 MG/0.4ML IJ SOSY: 40.0000 mg | PREFILLED_SYRINGE | INTRAMUSCULAR | Status: DC

## 2022-11-07 MED ORDER — HALOPERIDOL LACTATE 5 MG/ML IJ SOLN: 2.0000 mg | Freq: Two times a day (BID) | INTRAMUSCULAR | Status: DC | PRN

## 2022-11-07 MED ORDER — HALOPERIDOL LACTATE 5 MG/ML IJ SOLN: 2.0000 mg | Freq: Three times a day (TID) | INTRAMUSCULAR | Status: DC | PRN

## 2022-11-07 MED ORDER — HALOPERIDOL 1 MG PO TABS: 2.0000 mg | ORAL_TABLET | Freq: Three times a day (TID) | ORAL | Status: DC | PRN

## 2022-11-07 MED ORDER — HALOPERIDOL 1 MG PO TABS: 2.0000 mg | ORAL_TABLET | Freq: Every day | ORAL | Status: DC | PRN

## 2022-11-07 MED ORDER — DIAZEPAM 5 MG/ML IJ SOLN
10.0000 mg | Freq: Once | INTRAMUSCULAR | Status: DC
  Filled 2022-11-07: qty 2

## 2022-11-07 NOTE — ED Notes (Signed)
Staffing office called, No sitters available at this time.

## 2022-11-07 NOTE — Discharge Instructions (Addendum)
Dear Zachary Weber,   Thank you for letting us participate in your care! In this section, you will find a brief hospital admission summary of why you were admitted to the hospital, what happened during your admission, your diagnosis/diagnoses, and recommended follow up.  Primary diagnosis: altered mental status this was suspectedly related to drug use per information from the police. This resolved and you demonstrated capacity to make your own decisions  Secondary diagnosis: acute kidney injury: your kidney levels are abnormal. We discussed staying for fluids and treatment with further lab evaluation in the morning vs following up with your primary care doctor. You opted to follow up with your primary care doctor.  I do advise you stay hydrated and refrain from any drug or alcohol use.  POST-HOSPITAL & CARE INSTRUCTIONS We recommend following up with your PCP within 1 week from being discharged from the hospital.  DOCTOR'S APPOINTMENTS & FOLLOW UP No future appointments.   Thank you for choosing [facility]! Take care and be well!  Family Medicine Teaching Service Inpatient Team [place]  [facility]  [address] [place], [place] [address] [phone]

## 2022-11-07 NOTE — Assessment & Plan Note (Addendum)
Exhibits capacity for dispo, see hospital course
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
## 2022-11-07 NOTE — ED Notes (Signed)
Patient attempted to leave worked to diligently redirect upon 2 occasions. Pt walked out I called security who was able to peaceably redirect back to room
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
## 2022-11-07 NOTE — ED Notes (Signed)
Patient standing at door wanting to leave

## 2022-11-07 NOTE — ED Notes (Signed)
Patient has left before getting discharge papers.

## 2022-11-07 NOTE — ED Notes (Signed)
Patient eloped
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
## 2022-11-07 NOTE — Assessment & Plan Note (Addendum)
Uncooperative, agitated and borderline aggressive, witnessed drug ingestion during the time of arrest.  Poison control contacted by ED provider and advised to monitor for 8 hours and check back in in addition to labs.  Thus far Tylenol, salicylate, alcohol level unremarkable.  White count does not suggest infectious source.  I do have concern of his aggressive nature especially in the setting of known drug use therefore he has been IVC'd.  He is status post Valium and Versed which seem to be working appropriately currently.  Do not feel psychiatry assistance will be of any use at this time although may consider at a later date. -Admit to FMTS, attending Dr. Miquel Dunn, progressive unit -Follow-up with poison control around midnight -1:1 sitter -Neurochecks every 4 hours -Follow-up UDS that is able to be collected -Continuous cardiac monitoring x 12 hours -NPO diet -Follow-up UDS -Haldol 2 mg 3 times daily as needed for agitation, may combine with further Versed 4 mg IM -If necessary, Geodon 10 mg for agitation

## 2022-11-07 NOTE — Discharge Summary (Signed)
Family Medicine Teaching Service Discharge  Patient name: Zachary Weber Medical record number: [id number] Date of birth: 12-18-1985 Age: 37 y.o. Gender: male Date of Admission: 11/07/2022  Date of Discharge: 11/07/22 Admitting Physician: Shelby Mattocks, DO  Primary Care Provider: Patient, No Pcp Per  Patient Problem List   Diagnosis Date Noted   Ingestion of unknown drug 11/07/2022   AKI (acute kidney injury) [facility]) 11/07/2022    Brief Hospital Course:  Isidore Moos Droz is a 37 y.o.male with a history of drug use who was admitted to the [facility] Teaching Service at [facility] for altered mental status. His hospital course is detailed below:  Altered mental status Patient initially presented to the emergency room, brought in by [facility].  He had a witnessed ingestion of unknown substance, had drug paraphernalia including a crack cocaine pipe.  At the time of admission, endorsed abdominal pain after being arrested.  Initially, he received IV Valium and Versed to calm him.  Poison control was contacted and recommended to monitor him for 8 hours and then call them again for updated recommendations.  Because of initial concern for possible aggression in the setting of illicit drug use, placed IVC order.   After a couple of hours, patient was reassessed and was found to be back at his neurologic baseline, was demonstrating capacity.  At that time, the patient expressed that he wanted to leave the facility.  He was calm and cooperative, exhibited no aggression nor did he have any homicidal or suicidal tendencies.  He had full understanding of possible outcomes of his decision to leave the emergency room. He expressed that he wanted to see his PCP outpatient for his blood pressure (elevated to 160s/110s) as well as AKI and understood the need for this to be followed up.   As the patient was able to demonstrate capacity and was not a harm to himself or others without any evidence  of aggression, he did not meet IVC criteria and has decision-making capacity to leave on his own free well.  All of this was discussed with him with Dr. Scarlette Shorts. I was also present with Dr. Burna Forts and Dr. Celine Mans for assistance at bedside.   Hypertension Elevation of BP in ED, likely multifactorial in nature with recent drug use, stressful environment and underlying HTN. Patient will see his PCP outpatient for further follow up on this. He was asymptomatic and did not show signs of end organ damage, effectively ruling out concern for any HTN emergency.   AKI Cr 1.8 with GFR 49, unsure if he has CKD without enough data to make this decision. Will have close follow up with PCP, likely with component of dehydration.   Ensured safe disposition, will be picked up from ED by family member.   PCP Follow-up Recommendations: Repeat BMP for elevated creatinine Needs repeat BP, may need antihypertensive  Check for proteinuria if continued kidney injury  Discuss drug use outpatient   Discharge Diagnoses/Problem List:  Hospital Problem List      Hospital     * (Principal) Ingestion of unknown drug     Exhibits capacity for dispo, see hospital course         AKI (acute kidney injury) (HCC)     Likely prerenal although possibly related to drug ingestion. Follow up  with PCP        Disposition: Home   Discharge Condition: Stable   Discharge Exam:  General: NAD, pleasant, able to participate in  exam, answering questions appropriately, calm and cooperative Respiratory: No respiratory distress Skin: warm and dry, no rashes noted Psych: Normal affect and mood    Significant Procedures:  None   Significant Labs and Imaging:  Recent Labs  Lab 11/07/22 1500  WBC 9.7  HGB 15.7  HCT 46.7  PLT 287   Recent Labs  Lab 11/07/22 1500  NA 141  K 3.4*  CL 106  CO2 16*  GLUCOSE 132*  BUN 13  CREATININE 1.80*  CALCIUM 9.4  MG 2.1  ALKPHOS 64  AST 31  ALT 16   ALBUMIN 4.4     Discharge Medications:  Allergies as of 11/07/2022   No Known Allergies      Medication List     STOP taking these medications    benzonatate 100 MG capsule Commonly known as: TESSALON   hydrocortisone 2.5 % rectal cream Commonly known as: Anusol-HC   loratadine 10 MG tablet Commonly known as: CLARITIN   methocarbamol 500 MG tablet Commonly known as: ROBAXIN   naproxen 375 MG tablet Commonly known as: NAPROSYN   phenol 1.4 % Liqd Commonly known as: CHLORASEPTIC       TAKE these medications    acetaminophen 325 MG tablet Commonly known as: TYLENOL Take 162.5 mg by mouth every 6 (six) hours as needed for mild pain.        Discharge Instructions: Please refer to Patient Instructions section of EMR for full details.  Patient was counseled important signs and symptoms that should prompt return to medical care, changes in medications, dietary instructions, activity restrictions, and follow up appointments.   Follow-Up Appointments:  Follow-up Information     PCP. Schedule an appointment as soon as possible for a visit in 1 week(s).                  Alfredo Martinez, MD 11/07/2022, 8:01 PM PGY-2, [facility] Family Medicine

## 2022-11-07 NOTE — Hospital Course (Addendum)
Zachary Weber is a 37 y.o.male with a history of drug use who was admitted to the [facility] Teaching Service at [facility] for altered mental status. His hospital course is detailed below:  Altered mental status Patient initially presented to the emergency room, brought in by [facility].  He had a witnessed ingestion of unknown substance, had drug paraphernalia including a crack cocaine pipe.  At the time of admission, endorsed abdominal pain after being arrested.  Initially, he received IV Valium and Versed to calm him.  Poison control was contacted and recommended to monitor him for 8 hours and then call them again for updated recommendations.  Because of initial concern for possible aggression in the setting of illicit drug use, placed IVC order.   After a couple of hours, patient was reassessed and was found to be back at his neurologic baseline, was demonstrating capacity.  At that time, the patient expressed that he wanted to leave the facility.  He was calm and cooperative, exhibited no aggression nor did he have any homicidal or suicidal tendencies.  He had full understanding of possible outcomes of his decision to leave the emergency room. He expressed that he wanted to see his PCP outpatient for his blood pressure (elevated to 160s/110s) as well as AKI and understood the need for this to be followed up.   As the patient was able to demonstrate capacity and was not a harm to himself or others without any evidence of aggression, he did not meet IVC criteria and has decision-making capacity to leave on his own free well.  All of this was discussed with him with Dr. Scarlette Shorts. I was also present with Dr. Burna Forts and Dr. Celine Mans for assistance at bedside.   Hypertension Elevation of BP in ED, likely multifactorial in nature with recent drug use, stressful environment and underlying HTN. Patient will see his PCP outpatient for further follow up on this. He was  asymptomatic and did not show signs of end organ damage, effectively ruling out concern for any HTN emergency.   AKI Cr 1.8 with GFR 49, unsure if he has CKD without enough data to make this decision. Will have close follow up with PCP, likely with component of dehydration.   Ensured safe disposition, will be picked up from ED by family member.   PCP Follow-up Recommendations: Repeat BMP for elevated creatinine Needs repeat BP, may need antihypertensive  Check for proteinuria if continued kidney injury  Discuss drug use outpatient

## 2022-11-07 NOTE — H&P (Cosign Needed Addendum)
Hospital Admission History and Physical Service Pager: [phone]  Patient name: Zachary Weber Medical record number: [id number] Date of Birth: 29-Jul-1985 Age: 37 y.o. Gender: male  Primary Care Provider: Patient, No Pcp Per Consultants: Poison control Code Status: Full not confirmed by patient or family Preferred Emergency Contact: Not verified Contact Information     Name Relation Home Work Mobile   Zachary Weber Mother [phone]         Chief Complaint: Drug ingestion  Assessment and Plan: Zachary Weber is a 37 y.o. male presenting with known drug ingestion and combative behavior with agitation. Differential for presentation of this includes altered mental state and combativeness related to drug ingestion, psychosis of unknown etiology, infectious causes.  Most likely this is drug ingestion given it was witnessed, highly unlikely to be infectious cause in the setting of normal CBC and no further symptoms to suggest otherwise.  Unfortunately, patient is uncooperative and lack of history and physical exam and appropriate lab work makes treatment difficult.   Hospital Problem List      Hospital     * (Principal) Ingestion of unknown drug     Uncooperative, agitated and borderline aggressive, witnessed drug  ingestion during the time of arrest.  Poison control contacted by ED  provider and advised to monitor for 8 hours and check back in in addition  to labs.  Thus far Tylenol, salicylate, alcohol level unremarkable.  White  count does not suggest infectious source.  I do have concern of his  aggressive nature especially in the setting of known drug use therefore he  has been IVC'd.  He is status post Valium and Versed which seem to be  working appropriately currently.  Do not feel psychiatry assistance will  be of any use at this time although may consider at a later date. -Admit to FMTS, attending Dr. Miquel Dunn, progressive unit -Follow-up with poison control around  midnight -1:1 sitter -Neurochecks every 4 hours -Follow-up UDS that is able to be collected -Continuous cardiac monitoring x 12 hours -NPO diet -f/u CT ab/pel for abdominal pain when able to scan -Follow-up UDS -Haldol 2 mg 3 times daily as needed for agitation, may combine with  further Versed 4 mg IM -If necessary, Geodon 10 mg for agitation        AKI (acute kidney injury) (HCC)     Likely prerenal although possibly related to drug ingestion.   Unfortunately he is not in the state to receive IV fluids. -AM BMP     FEN/GI: N.p.o. VTE Prophylaxis: Lovenox  Disposition: Progressive with one-to-one sitter  History of Present Illness:  Zachary Weber is a 37 y.o. male presenting via police with witnessed ingestion of unknown substance.  Per ED provider he had drug paraphernalia including crack cocaine pipe.  He endorsed abdominal pain after being arrested and during my encounter and ED encounter continued to yell "we need to find Zachary Weber".  During my encounter, patient would not answer any direct questions and continued to rock back-and-forth.  He did make aggressive comments like you are going to kill me or Zachary Weber is going to die if I do not believe.  He was uncooperative for neurological physical exam.  He did not voice any known contacts that I may be able to reach on his behalf.  He would not answer if he was having any pain.  In the ED, patient received 5 mg IV Valium which was partially effective and then received IM 4  mg Versed which is better able to calm patient.  Review Of Systems: Per HPI  Pertinent Past Medical History: Unknown Remainder reviewed in history tab.   Pertinent Past Surgical History: Appendectomy per chart review Remainder reviewed in history tab.  Pertinent Social History: Tobacco use: Yes 0.5 packs/day of cigarettes per chart review Alcohol use: Unknown Other Substance use: Positive for cocaine, amphetamines, THC on prior UDS Unknown living  history  Pertinent Family History: Unknown Remainder reviewed in history tab.   Important Outpatient Medications: Unknown Remainder reviewed in medication history.   Objective: BP 136/77   Pulse (!) 130   Temp 99.3 F (37.4 C) (Oral)   Resp (!) 24   Ht 5\' 10"  (1.778 m)   Wt 72.6 kg   SpO2 97%   BMI 22.96 kg/m  Exam: General: Uncooperative in conversation, no acute distress Eyes: Unable to assess Cardiovascular: RRR, no murmurs auscultated Respiratory: CTAB, normal WOB Gastrointestinal: Unable to assess Neuro: Unable to assess Derm: Dried blood on right wrist and fingers secondary to IV blood draw Psych: Agitated, yelling obscenities, elevated mood  Labs:  CBC BMET  Recent Labs  Lab 11/07/22 1500  WBC 9.7  HGB 15.7  HCT 46.7  PLT 287   Recent Labs  Lab 11/07/22 1500  NA 141  K 3.4*  CL 106  CO2 16*  BUN 13  CREATININE 1.80*  GLUCOSE 132*  CALCIUM 9.4     Troponin: 19 CK: 251 WNL Salicylate level <7 Acetaminophen level <10 Alcohol level <10  EKG: N/A  Imaging Studies Performed: No imaging studies  Shelby Mattocks, DO 11/07/2022, 7:05 PM PGY-2, [facility]  FPTS Intern pager: [phone], text pages welcome Secure chat group [facility] Teaching Service

## 2022-11-07 NOTE — ED Notes (Signed)
Patient continuing to yell and scream and be uncooperative. Medications given, see MAR. EDP aware.

## 2022-11-07 NOTE — ED Provider Notes (Cosign Needed Addendum)
EMERGENCY DEPARTMENT AT [facility] Provider Note   CSN: [id number] Arrival date & time: 11/07/22  1443     History  Chief Complaint  Patient presents with   Ingestion    Zachary Weber is a 37 y.o. male presented to ER after ingesting an unknown substance.  Patient was pulled over by an officer and was acting strange to the officer searched his car and found drug paraphernalia including a crack cocaine pipe.  Patient would not open his mouth when the officer was searching him and instead ingested a substance under his tongue.  Patient has to be brought to the ER after endorsing abdominal pain after being arrested and in the back of the cop car however when they arrived to the ER patient ran around the ER after getting out of the car until he was finally subdued.  Patient continues to yell out "they need to find Melissa" and that his chest and stomach hurts but cannot further elaborate.  Patient states he does not do drugs.  ROS was unable to be obtained as patient would continue to yell out "they need to find Melissa" and that his chest and stomach hurt.  Home Medications Prior to Admission medications   Medication Sig Start Date End Date Taking? Authorizing Provider  acetaminophen (TYLENOL) 325 MG tablet Take 162.5 mg by mouth every 6 (six) hours as needed for mild pain.    [provider]  benzonatate (TESSALON) 100 MG capsule Take 1 capsule (100 mg total) by mouth every 8 (eight) hours. 04/02/19   Caccavale, Sophia, PA-C  hydrocortisone (ANUSOL-HC) 2.5 % rectal cream Place 1 application rectally 2 (two) times daily. 07/04/19   Maxwell Caul, PA-C  loratadine (CLARITIN) 10 MG tablet Take 10 mg by mouth daily as needed for allergies.     [provider]  methocarbamol (ROBAXIN) 500 MG tablet Take 1 tablet (500 mg total) by mouth 2 (two) times daily as needed for muscle spasms. 03/22/22   Schutt, Edsel Petrin, PA-C  naproxen (NAPROSYN) 375 MG  tablet Take 1 tablet twice daily as needed for shoulder pain. 05/29/18   Molpus, John, MD  phenol (CHLORASEPTIC) 1.4 % LIQD Use as directed 1 spray in the mouth or throat as needed for throat irritation / pain. 04/02/19   Caccavale, Sophia, PA-C      Allergies    Patient has no known allergies.    Review of Systems   Review of Systems Unable to be obtained Physical Exam Updated Vital Signs BP 136/77   Pulse (!) 130   Temp 99.3 F (37.4 C) (Oral)   Resp (!) 24   Ht 5\' 10"  (1.778 m)   Wt 72.6 kg   SpO2 97%   BMI 22.96 kg/m  Physical Exam Constitutional:      Comments: Agitated  HENT:     Head: Normocephalic and atraumatic.  Eyes:     Extraocular Movements: Extraocular movements intact.     Conjunctiva/sclera: Conjunctivae normal.     Comments: Dilated pupils bilaterally that are PERRL  Cardiovascular:     Rate and Rhythm: Regular rhythm. Tachycardia present.     Pulses: Normal pulses.     Heart sounds: Normal heart sounds.  Pulmonary:     Effort: Pulmonary effort is normal.     Breath sounds: Normal breath sounds.     Comments: Tachypneic Yelling Abdominal:     Palpations: Abdomen is soft.     Tenderness: There is no abdominal  tenderness. There is no guarding or rebound.  Musculoskeletal:        General: Normal range of motion.     Cervical back: Normal range of motion.  Neurological:     Mental Status: He is alert.     Comments: Unable to perform neurologic exam as patient was unable to follow commands     ED Results / Procedures / Treatments   Labs (all labs ordered are listed, but only abnormal results are displayed) Labs Reviewed  COMPREHENSIVE METABOLIC PANEL - Abnormal; Notable for the following components:      Result Value   Potassium 3.4 (*)    CO2 16 (*)    Glucose, Bld 132 (*)    Creatinine, Ser 1.80 (*)    GFR, Estimated 49 (*)    Anion gap 19 (*)    All other components within normal limits  SALICYLATE LEVEL - Abnormal; Notable for the  following components:   Salicylate Lvl <7.0 (*)    All other components within normal limits  ACETAMINOPHEN LEVEL - Abnormal; Notable for the following components:   Acetaminophen (Tylenol), Serum <10 (*)    All other components within normal limits  TROPONIN I (HIGH SENSITIVITY) - Abnormal; Notable for the following components:   Troponin I (High Sensitivity) 19 (*)    All other components within normal limits  ETHANOL  CBC WITH DIFFERENTIAL/PLATELET  MAGNESIUM  CK  RAPID URINE DRUG SCREEN, HOSP PERFORMED  ACETAMINOPHEN LEVEL  CBG MONITORING, ED  I-STAT CHEM 8, ED  TROPONIN I (HIGH SENSITIVITY)    EKG None  Radiology No results found.  Procedures .Critical Care  Performed by: Netta Corrigan, PA-C Authorized by: Netta Corrigan, PA-C   Critical care provider statement:    Critical care time (minutes):  40   Critical care time was exclusive of:  Separately billable procedures and treating other patients   Critical care was necessary to treat or prevent imminent or life-threatening deterioration of the following conditions:  Toxidrome   Critical care was time spent personally by me on the following activities:  Development of treatment plan with patient or surrogate, discussions with consultants, evaluation of patient's response to treatment, examination of patient, obtaining history from patient or surrogate, review of old charts, re-evaluation of patient's condition, pulse oximetry, ordering and review of radiographic studies, ordering and review of laboratory studies and ordering and performing treatments and interventions   I assumed direction of critical care for this patient from another provider in my specialty: no     Care discussed with comment:  Poison Control     Medications Ordered in ED Medications  sodium chloride 0.9 % bolus 1,000 mL (0 mLs Intravenous Stopped 11/07/22 1705)  diazepam (VALIUM) injection 5 mg (5 mg Intravenous Given 11/07/22 1501)  midazolam  PF (VERSED) injection 4 mg (4 mg Intramuscular Given 11/07/22 1654)    ED Course/ Medical Decision Making/ A&P Clinical Course as of 11/07/22 1749  Tue Nov 07, 2022  1459 Poison Control:  [JS]  1503 Repeat apap at 4 hr mark EKG, CK Supportive care: valium, fluids 8hrs obs [JS]    Clinical Course User Index [JS] Netta Corrigan, PA-C                             Medical Decision Making Amount and/or Complexity of Data Reviewed Labs: ordered. Radiology: ordered.  Risk Prescription drug management. Decision regarding hospitalization.   Zachary E  Weber 37 y.o. presented today for overdose. Working DDx that I considered at this time includes, but not limited to, sympathomimetic overdose, dehydration, AKI, ACS, rhabdomyolysis.  R/o DDx: pending  Review of prior external notes: 03/22/2022 ED  Unique Tests and My Interpretation:  CBC: Unremarkable Acetaminophen level: Negative Magnesium: Negative Ethanol: Negative I-STAT Chem-8: Salicylate level: Unremarkable CK: 251 Troponin: 19 CMP: pending UDS: pending CBG: pending EKG: pending CT abdomen pelvis contrast:  Discussion with Independent Historian:  Police officers  Discussion of Management of Tests:  [place], DO Family Medicine  Risk: High: hospitalization or escalation of hospital-level care  Risk Stratification Score: none  Staffed with Durwin Nora, MD; Criss Alvine, MD  Plan: On exam patient was agitated and tachycardic and tachypneic.  Patient was also diaphoretic.  Patient was brought in after he was pulled over by police officer with crack cocaine pipe found in his car and ingesting an unknown substance.  At this time I highly suspect patient is taking sympathomimetic including but not limited to crack, cocaine, ecstasy.  I spoke to poison control and put in labs at their request.  They also recommended a acetaminophen level at the 4-hour mark and a CK.  Patient was given 5 mg of Valium for his agitation however  patient continued to be agitated 50 minutes later and was given another 5 mg of Valium.  Patient repeatedly kept saying that the officers were assaulting him by "sticking their fingers in my butt." Patient was in my full view the entire time with the door open and the officers were not doing this.  Patient was also given fluids as he was tachycardic in case he is dehydrated.  Patient stable at this time.  Patient continues to endorse abdominal pain and now is endorsing rectal pain.  According to the officers patient has history of putting drugs of his rectum.  Due to patient's ongoing pain a CT scan was ordered to further evaluate.  When I went to go update the patient on the plan patient refused to talk with me.  Patient stable at this time.  Nurse was going to give patient his second dose of Valium IV however patient adamantly refused and was tossing and turning around the bed making it difficult.  Nurse stated that the IV and patient's hand would not flush and that we will need a second 1 for CT.  The Valium was changed to IM Versed and patient was held down so that he could receive a sedative as he was extremely agitated.  Patient repeatedly kept yelling " y'all have to kill me I know too much."  Patient does not have psych history and so I suspect patient's agitation is most likely from his overdose as opposed to psychogenic cause.  Poison control called back to the nurse and stated that since we are unsure of what he ingested patient will need 24-hour obs.  Patient will be admitted to the hospitalist.  After receiving the first approximately 3 to 4 minutes later patient did appear to calm down a little bit.  Patient stable for admission at this time.  I spoke to the hospitalist and patient was accepted for admission.  Patient stable for admission at this time and is much calmer after receiving the Versed.   Final Clinical Impression(s) / ED Diagnoses Final diagnoses:  Overdose of undetermined  intent, initial encounter    Rx / DC Orders ED Discharge Orders     None         Teigen Parslow,  Beverly Gust, PA-C 11/07/22 1750    Gloris Manchester, MD 11/08/22 [phone]

## 2023-10-22 ENCOUNTER — Emergency Department (HOSPITAL_COMMUNITY): Admission: EM | Admit: 2023-10-22 | Discharge: 2023-10-22 | Discharge status: Against Medical Advice | Payer: Self-pay

## 2023-10-22 NOTE — ED Notes (Signed)
 Pt left as soon as EMS had him registered. Pt walked out ambulance doors.

## 2023-12-23 ENCOUNTER — Emergency Department (HOSPITAL_BASED_OUTPATIENT_CLINIC_OR_DEPARTMENT_OTHER)
Admission: EM | Admit: 2023-12-23 | Discharge: 2023-12-23 | Disposition: A | Discharge status: Home / Self Care | Payer: Self-pay | Attending: Emergency Medicine | Admitting: Emergency Medicine

## 2023-12-23 ENCOUNTER — Encounter (HOSPITAL_BASED_OUTPATIENT_CLINIC_OR_DEPARTMENT_OTHER): Payer: Self-pay

## 2023-12-23 ENCOUNTER — Other Ambulatory Visit: Payer: Self-pay

## 2023-12-23 DIAGNOSIS — T782XXA Anaphylactic shock, unspecified, initial encounter: Secondary | ICD-10-CM | POA: Insufficient documentation

## 2023-12-23 LAB — BASIC METABOLIC PANEL WITH GFR
Anion gap: 16 — ABNORMAL HIGH (ref 5–15)
BUN: 13 mg/dL (ref 6–20)
CO2: 22 mmol/L (ref 22–32)
Calcium: 9.3 mg/dL (ref 8.9–10.3)
Chloride: 103 mmol/L (ref 98–111)
Creatinine, Ser: 1.17 mg/dL (ref 0.61–1.24)
GFR, Estimated: >60 mL/min (ref >60)
Glucose, Bld: 121 mg/dL — ABNORMAL HIGH (ref 70–99)
Potassium: 3.3 mmol/L — ABNORMAL LOW (ref 3.5–5.1)
Sodium: 141 mmol/L (ref 135–145)

## 2023-12-23 LAB — CBC WITH DIFFERENTIAL/PLATELET
Abs Immature Granulocytes: 0.07 K/uL (ref 0.00–0.07)
Basophils Absolute: 0 K/uL (ref 0.0–0.1)
Basophils Relative: 0 %
Eosinophils Absolute: 0.1 K/uL (ref 0.0–0.5)
Eosinophils Relative: 1 %
HCT: 45.5 % (ref 39.0–52.0)
Hemoglobin: 16.1 g/dL (ref 13.0–17.0)
Immature Granulocytes: 1 %
Lymphocytes Relative: 45 %
Lymphs Abs: 3.5 K/uL (ref 0.7–4.0)
MCH: 32.1 pg (ref 26.0–34.0)
MCHC: 35.4 g/dL (ref 30.0–36.0)
MCV: 90.8 fL (ref 80.0–100.0)
Monocytes Absolute: 0.5 K/uL (ref 0.1–1.0)
Monocytes Relative: 7 %
Neutro Abs: 3.5 K/uL (ref 1.7–7.7)
Neutrophils Relative %: 46 %
Platelets: 330 K/uL (ref 150–400)
RBC: 5.01 MIL/uL (ref 4.22–5.81)
RDW: 13 % (ref 11.5–15.5)
WBC: 7.8 K/uL (ref 4.0–10.5)
nRBC: 0 % (ref 0.0–0.2)

## 2023-12-23 MED ORDER — SODIUM CHLORIDE 0.9 % IV BOLUS
1000.0000 mL | Freq: Once | INTRAVENOUS | Status: AC
  Administered 2023-12-23: 1000 mL via INTRAVENOUS

## 2023-12-23 MED ORDER — METHYLPREDNISOLONE SODIUM SUCC 125 MG IJ SOLR
125.0000 mg | Freq: Once | INTRAMUSCULAR | Status: AC
  Administered 2023-12-23: 125 mg via INTRAVENOUS
  Filled 2023-12-23: qty 2

## 2023-12-23 MED ORDER — EPINEPHRINE 0.3 MG/0.3ML IJ SOAJ
0.3000 mg | Freq: Once | INTRAMUSCULAR | Status: AC
  Administered 2023-12-23: 0.3 mg via INTRAMUSCULAR

## 2023-12-23 MED ORDER — FAMOTIDINE IN NACL 20-0.9 MG/50ML-% IV SOLN
20.0000 mg | Freq: Once | INTRAVENOUS | Status: AC
  Administered 2023-12-23: 20 mg via INTRAVENOUS
  Filled 2023-12-23: qty 50

## 2023-12-23 MED ORDER — ONDANSETRON HCL 4 MG/2ML IJ SOLN
4.0000 mg | Freq: Once | INTRAMUSCULAR | Status: AC
  Administered 2023-12-23: 4 mg via INTRAVENOUS
  Filled 2023-12-23: qty 2

## 2023-12-23 MED ORDER — LACTATED RINGERS IV BOLUS: 1000.0000 mL | Freq: Once | INTRAVENOUS | Status: DC

## 2023-12-23 MED ORDER — EPINEPHRINE 0.3 MG/0.3ML IJ SOAJ: 0.3000 mg | INTRAMUSCULAR | 0 refills | Status: AC | PRN

## 2023-12-23 NOTE — Discharge Instructions (Addendum)
 If you begin to experience throat swelling, trouble with your breathing, immediately use your EpiPen  and go to the nearest ED.

## 2023-12-23 NOTE — ED Notes (Signed)
 Pt resting quietly; sts feels a lot better; requesting something to drink; EDP Zachary Weber wants him to remain NPO; pt verbalizes understanding

## 2023-12-23 NOTE — ED Triage Notes (Signed)
 Reports laying in bed PTA, lips started swelling, hives noted to BIL arms and Bil legs, itching, SHOB.   EDP at bedside

## 2023-12-23 NOTE — ED Provider Notes (Signed)
 [place] [facility] Provider Note   CSN: [id number] Arrival date & time: 12/23/23  1725     Patient presents with: Allergic Reaction   Zachary Weber is a 38 y.o. male.   This is a 38 year old male here today with lip swelling, throat swelling, hives and nausea.  Patient was feeling short of breath he has not had a history of an allergic reaction before.  He does not take any medications every day.  He was staying in a hotel with his partner, was laying in bed when this began to happen.  He ate cookout an hour earlier, has not had issues eating cookout previously.   Allergic Reaction      Prior to Admission medications   Medication Sig Start Date End Date Taking? Authorizing Provider  EPINEPHrine  0.3 mg/0.3 mL IJ SOAJ injection Inject 0.3 mg into the muscle as needed for anaphylaxis. 12/23/23  Yes Mannie Pac T, DO  acetaminophen  (TYLENOL ) 325 MG tablet Take 162.5 mg by mouth every 6 (six) hours as needed for mild pain.    [provider]    Allergies: Patient has no known allergies.    Review of Systems  Updated Vital Signs BP 125/83   Pulse 88   Resp 19   Wt 99.8 kg   SpO2 98%   BMI 31.57 kg/m   Physical Exam Vitals and nursing note reviewed.  HENT:     Head: Normocephalic.     Mouth/Throat:     Comments: Swelling of the lips, no tongue swelling.  Non stridulous Cardiovascular:     Rate and Rhythm: Normal rate.  Pulmonary:     Breath sounds: No wheezing.  Abdominal:     General: Abdomen is flat.  Musculoskeletal:        General: Normal range of motion.  Skin:    General: Skin is warm.  Neurological:     General: No focal deficit present.     (all labs ordered are listed, but only abnormal results are displayed) Labs Reviewed  BASIC METABOLIC PANEL WITH GFR - Abnormal; Notable for the following components:      Result Value   Potassium 3.3 (*)    Glucose, Bld 121 (*)    Anion gap 16 (*)    All  other components within normal limits  CBC WITH DIFFERENTIAL/PLATELET    EKG: None  Radiology: No results found.   .Critical Care  Performed by: Mannie Pac DASEN, DO Authorized by: Mannie Pac DASEN, DO   Critical care provider statement:    Critical care time (minutes):  30   Critical care was necessary to treat or prevent imminent or life-threatening deterioration of the following conditions: Anaphylaxis.   Critical care was time spent personally by me on the following activities:  Development of treatment plan with patient or surrogate, discussions with consultants, evaluation of patient's response to treatment, examination of patient, ordering and review of laboratory studies, ordering and review of radiographic studies, ordering and performing treatments and interventions, pulse oximetry, re-evaluation of patient's condition and review of old charts    Medications Ordered in the ED  EPINEPHrine  (EPI-PEN) injection 0.3 mg (0.3 mg Intramuscular Given 12/23/23 1731)  famotidine  (PEPCID ) IVPB 20 mg premix (0 mg Intravenous Stopped 12/23/23 1810)  methylPREDNISolone  sodium succinate (SOLU-MEDROL ) 125 mg/2 mL injection 125 mg (125 mg Intravenous Given 12/23/23 1740)  sodium chloride  0.9 % bolus 1,000 mL ( Intravenous Stopped 12/23/23 1840)  ondansetron  (ZOFRAN ) injection 4 mg (  4 mg Intravenous Given 12/23/23 1747)                                    Medical Decision Making 38 year old male here today with an allergic reaction.  Plan-patient with anaphylaxis.  Received IM epi immediately upon arrival.  Steroids, famotidine  ordered.  Will continue to observe the patient in ED.  Reassessment 8:30 PM-patient's symptoms have resolved, no recurrence.  Discharged with prescription for EpiPen .  Return precaution discussed with patient at bedside.  Amount and/or Complexity of Data Reviewed Labs: ordered.  Risk Prescription drug management.        Final diagnoses:  Anaphylaxis,  initial encounter    ED Discharge Orders          Ordered    EPINEPHrine  0.3 mg/0.3 mL IJ SOAJ injection  As needed        12/23/23 2029               Mannie Pac T, DO 12/23/23 2029

## 2023-12-23 NOTE — ED Notes (Signed)
 Epipen  adminstered to Right upper thigh. EDP gave verbal order

## 2023-12-23 NOTE — ED Notes (Signed)
 Pt presents to nurses station noting that he was told 830 he could leave and he was not waiting any longer.  Pt AVS was printed promptly at 829 and then papers were given to him in hall way.  No iv access present. Pt left steady gait to lobby,
